# Patient Record
Sex: Male | Born: 1995 | Race: Black or African American | Hispanic: No | Marital: Single | Smoking: Never smoker
Health system: Southern US, Community
[De-identification: ages and names within clinical notes are randomized; demographics above are authoritative.]

## PROBLEM LIST (undated history)

## (undated) DIAGNOSIS — Z8249 Family history of ischemic heart disease and other diseases of the circulatory system: Secondary | ICD-10-CM

## (undated) DIAGNOSIS — R002 Palpitations: Secondary | ICD-10-CM

## (undated) DIAGNOSIS — F419 Anxiety disorder, unspecified: Secondary | ICD-10-CM

## (undated) HISTORY — DX: Palpitations: R00.2

## (undated) HISTORY — PX: NO PAST SURGERIES: SHX2092

## (undated) HISTORY — DX: Family history of ischemic heart disease and other diseases of the circulatory system: Z82.49

## (undated) HISTORY — DX: Anxiety disorder, unspecified: F41.9

---

## 2017-01-18 DIAGNOSIS — J00 Acute nasopharyngitis [common cold]: Secondary | ICD-10-CM | POA: Diagnosis not present

## 2017-01-26 DIAGNOSIS — Z23 Encounter for immunization: Secondary | ICD-10-CM | POA: Diagnosis not present

## 2017-01-26 DIAGNOSIS — J45909 Unspecified asthma, uncomplicated: Secondary | ICD-10-CM | POA: Diagnosis not present

## 2017-01-26 DIAGNOSIS — Z13220 Encounter for screening for lipoid disorders: Secondary | ICD-10-CM | POA: Diagnosis not present

## 2017-01-26 DIAGNOSIS — Z113 Encounter for screening for infections with a predominantly sexual mode of transmission: Secondary | ICD-10-CM | POA: Diagnosis not present

## 2017-01-26 DIAGNOSIS — R0989 Other specified symptoms and signs involving the circulatory and respiratory systems: Secondary | ICD-10-CM | POA: Diagnosis not present

## 2017-01-26 DIAGNOSIS — Z0000 Encounter for general adult medical examination without abnormal findings: Secondary | ICD-10-CM | POA: Diagnosis not present

## 2017-02-15 DIAGNOSIS — R030 Elevated blood-pressure reading, without diagnosis of hypertension: Secondary | ICD-10-CM | POA: Diagnosis not present

## 2017-02-15 DIAGNOSIS — J029 Acute pharyngitis, unspecified: Secondary | ICD-10-CM | POA: Diagnosis not present

## 2017-02-22 DIAGNOSIS — J040 Acute laryngitis: Secondary | ICD-10-CM | POA: Diagnosis not present

## 2017-02-22 DIAGNOSIS — J3501 Chronic tonsillitis: Secondary | ICD-10-CM | POA: Diagnosis not present

## 2017-02-22 DIAGNOSIS — J320 Chronic maxillary sinusitis: Secondary | ICD-10-CM | POA: Diagnosis not present

## 2017-02-22 DIAGNOSIS — J322 Chronic ethmoidal sinusitis: Secondary | ICD-10-CM | POA: Diagnosis not present

## 2017-03-30 DIAGNOSIS — Z113 Encounter for screening for infections with a predominantly sexual mode of transmission: Secondary | ICD-10-CM | POA: Diagnosis not present

## 2017-06-04 DIAGNOSIS — Z113 Encounter for screening for infections with a predominantly sexual mode of transmission: Secondary | ICD-10-CM | POA: Diagnosis not present

## 2017-07-27 DIAGNOSIS — R7301 Impaired fasting glucose: Secondary | ICD-10-CM | POA: Diagnosis not present

## 2017-07-27 DIAGNOSIS — J45909 Unspecified asthma, uncomplicated: Secondary | ICD-10-CM | POA: Diagnosis not present

## 2017-07-27 DIAGNOSIS — Z13220 Encounter for screening for lipoid disorders: Secondary | ICD-10-CM | POA: Diagnosis not present

## 2017-07-27 DIAGNOSIS — E669 Obesity, unspecified: Secondary | ICD-10-CM | POA: Diagnosis not present

## 2017-07-27 DIAGNOSIS — R37 Sexual dysfunction, unspecified: Secondary | ICD-10-CM | POA: Diagnosis not present

## 2017-10-18 DIAGNOSIS — Z23 Encounter for immunization: Secondary | ICD-10-CM | POA: Diagnosis not present

## 2017-10-18 DIAGNOSIS — J45909 Unspecified asthma, uncomplicated: Secondary | ICD-10-CM | POA: Diagnosis not present

## 2017-10-18 DIAGNOSIS — N529 Male erectile dysfunction, unspecified: Secondary | ICD-10-CM | POA: Diagnosis not present

## 2017-10-18 DIAGNOSIS — R030 Elevated blood-pressure reading, without diagnosis of hypertension: Secondary | ICD-10-CM | POA: Diagnosis not present

## 2017-10-18 DIAGNOSIS — Z113 Encounter for screening for infections with a predominantly sexual mode of transmission: Secondary | ICD-10-CM | POA: Diagnosis not present

## 2017-12-05 DIAGNOSIS — R030 Elevated blood-pressure reading, without diagnosis of hypertension: Secondary | ICD-10-CM | POA: Diagnosis not present

## 2017-12-08 ENCOUNTER — Encounter (HOSPITAL_COMMUNITY): Payer: Self-pay | Admitting: *Deleted

## 2017-12-08 ENCOUNTER — Emergency Department (HOSPITAL_COMMUNITY)
Admission: EM | Admit: 2017-12-08 | Discharge: 2017-12-08 | Disposition: A | Discharge status: Home / Self Care | Payer: BLUE CROSS/BLUE SHIELD | Attending: Emergency Medicine | Admitting: Emergency Medicine

## 2017-12-08 ENCOUNTER — Other Ambulatory Visit: Payer: Self-pay

## 2017-12-08 ENCOUNTER — Emergency Department (HOSPITAL_COMMUNITY): Payer: BLUE CROSS/BLUE SHIELD

## 2017-12-08 DIAGNOSIS — R002 Palpitations: Secondary | ICD-10-CM | POA: Insufficient documentation

## 2017-12-08 DIAGNOSIS — R000 Tachycardia, unspecified: Secondary | ICD-10-CM | POA: Diagnosis not present

## 2017-12-08 DIAGNOSIS — I10 Essential (primary) hypertension: Secondary | ICD-10-CM | POA: Diagnosis not present

## 2017-12-08 DIAGNOSIS — I499 Cardiac arrhythmia, unspecified: Secondary | ICD-10-CM | POA: Diagnosis not present

## 2017-12-08 LAB — CBC WITH DIFFERENTIAL/PLATELET
Abs Immature Granulocytes: 0.01 10*3/uL (ref 0.00–0.07)
BASOS ABS: 0 10*3/uL (ref 0.0–0.1)
Basophils Relative: 0 %
EOS PCT: 3 %
Eosinophils Absolute: 0.1 10*3/uL (ref 0.0–0.5)
HEMATOCRIT: 50 % (ref 39.0–52.0)
HEMOGLOBIN: 16.1 g/dL (ref 13.0–17.0)
Immature Granulocytes: 0 %
LYMPHS ABS: 1.3 10*3/uL (ref 0.7–4.0)
Lymphocytes Relative: 27 %
MCH: 28 pg (ref 26.0–34.0)
MCHC: 32.2 g/dL (ref 30.0–36.0)
MCV: 87 fL (ref 80.0–100.0)
Monocytes Absolute: 0.5 10*3/uL (ref 0.1–1.0)
Monocytes Relative: 10 %
NEUTROS ABS: 2.9 10*3/uL (ref 1.7–7.7)
NEUTROS PCT: 60 %
Platelets: 212 10*3/uL (ref 150–400)
RBC: 5.75 MIL/uL (ref 4.22–5.81)
RDW: 12.9 % (ref 11.5–15.5)
WBC: 4.9 10*3/uL (ref 4.0–10.5)
nRBC: 0 % (ref 0.0–0.2)

## 2017-12-08 LAB — BASIC METABOLIC PANEL
Anion gap: 7 (ref 5–15)
BUN: 12 mg/dL (ref 6–20)
CALCIUM: 9.2 mg/dL (ref 8.9–10.3)
CO2: 25 mmol/L (ref 22–32)
Chloride: 104 mmol/L (ref 98–111)
Creatinine, Ser: 1.08 mg/dL (ref 0.61–1.24)
GFR calc Af Amer: >60 mL/min (ref >60)
Glucose, Bld: 112 mg/dL — ABNORMAL HIGH (ref 70–99)
POTASSIUM: 3.8 mmol/L (ref 3.5–5.1)
Sodium: 136 mmol/L (ref 135–145)

## 2017-12-08 LAB — TSH: TSH: 0.915 u[IU]/mL (ref 0.350–4.500)

## 2017-12-08 LAB — I-STAT TROPONIN, ED: Troponin i, poc: 0 ng/mL (ref 0.00–0.08)

## 2017-12-08 MED ORDER — LORAZEPAM 1 MG PO TABS
1.0000 mg | ORAL_TABLET | Freq: Once | ORAL | Status: AC
  Administered 2017-12-08: 1 mg via ORAL
  Filled 2017-12-08: qty 1

## 2017-12-08 NOTE — ED Triage Notes (Signed)
Pt called EMS because he felt his heart beating fast. Ems reported HER of 140 on arrival to Pt home ,BP 161/92. Marland Kitchen Pt reported he went to [facility] for same on this past WED.

## 2017-12-08 NOTE — ED Notes (Signed)
Pt verbalized understanding of discharge paperwork and follow-up care.  °

## 2017-12-08 NOTE — ED Provider Notes (Signed)
[facility] EMERGENCY DEPARTMENT Provider Note   CSN: [id number] Arrival date & time: 12/08/17  1254     History   Chief Complaint Chief Complaint  Patient presents with  . Palpitations    HPI Dean Barrett is a 22 y.o. male.  He is complaining about 1 week of intermittent palpitations.  Says his heart rate will go fast intermittently throughout the day.  It causes him to feel really anxious when it happens.  It does not seem to be predictably related around anything although when he is working out he says he does not have any problem with it.  He denies any chest pain or shortness of breath.  No recent illness.  He said he was taking some over-the-counter workout supplements last week but he has stopped them and he is continuing to have symptoms.  He went to an urgent care clinic a few days ago and they told him he needed to follow-up with his primary care doctor.  They have a family history of cardiomyopathy.  The history is provided by the patient.  Palpitations   This is a new problem. The current episode started more than 1 week ago. The problem occurs daily. The problem has not changed since onset.The problem is associated with an unknown factor. Pertinent negatives include no fever, no chest pain, no syncope, no abdominal pain, no nausea, no vomiting, no headaches, no cough, no hemoptysis and no shortness of breath. He has tried nothing for the symptoms. The treatment provided no relief. Risk factors include family history.    History reviewed. No pertinent past medical history.  There are no active problems to display for this patient.   History reviewed. No pertinent surgical history.      Home Medications    Prior to Admission medications   Not on File    Family History History reviewed. No pertinent family history.  Social History Social History   Tobacco Use  . Smoking status: Never Smoker  . Smokeless tobacco: Never Used  Substance Use  Topics  . Alcohol use: Not on file    Comment: social  . Drug use: Not Currently     Allergies   Patient has no allergy information on record.   Review of Systems Review of Systems  Constitutional: Negative for fever.  HENT: Negative for sore throat.   Eyes: Negative for visual disturbance.  Respiratory: Negative for cough, hemoptysis and shortness of breath.   Cardiovascular: Positive for palpitations. Negative for chest pain and syncope.  Gastrointestinal: Negative for abdominal pain, nausea and vomiting.  Genitourinary: Negative for dysuria.  Musculoskeletal: Negative for neck pain.  Skin: Negative for rash.  Neurological: Negative for headaches.     Physical Exam Updated Vital Signs BP 139/68 (BP Location: Right Arm)   Pulse 94   Temp 98.2 F (36.8 C) (Oral)   Resp 18   Ht 5\' 7"  (1.702 m)   Wt 111.6 kg   SpO2 98%   BMI 38.53 kg/m   Physical Exam  Constitutional: He appears well-developed and well-nourished.  HENT:  Head: Normocephalic and atraumatic.  Eyes: Conjunctivae are normal.  Neck: Neck supple. No tracheal deviation present. No thyromegaly present.  Cardiovascular: Regular rhythm and normal pulses. Tachycardia present.  No murmur heard. Pulmonary/Chest: Effort normal and breath sounds normal. No respiratory distress.  Abdominal: Soft. There is no tenderness.  Musculoskeletal: He exhibits no edema, tenderness or deformity.  Neurological: He is alert.  Skin: Skin is warm and dry.  Psychiatric: He has a normal mood and affect.  Nursing note and vitals reviewed.    ED Treatments / Results  Labs (all labs ordered are listed, but only abnormal results are displayed) Labs Reviewed  BASIC METABOLIC PANEL - Abnormal; Notable for the following components:      Result Value   Glucose, Bld 112 (*)    All other components within normal limits  CBC WITH DIFFERENTIAL/PLATELET  TSH  I-STAT TROPONIN, ED    EKG EKG Interpretation  Date/Time:  Saturday  December 08 2017 12:55:30 EST Ventricular Rate:  89 PR Interval:    QRS Duration: 107 QT Interval:  366 QTC Calculation: 446 R Axis:   77 Text Interpretation:  Sinus rhythm ST elev, probable normal early repol pattern Artifact in lead(s) V2 no prior to compare with Confirmed by Meridee Score [phone]) on 12/08/2017 1:12:38 PM   Radiology Dg Chest 2 View  Result Date: 12/08/2017 CLINICAL DATA:  Palpitations. EXAM: CHEST - 2 VIEW COMPARISON:  None. FINDINGS: The heart size and mediastinal contours are within normal limits. Both lungs are clear. The visualized skeletal structures are unremarkable. IMPRESSION: No active cardiopulmonary disease. Electronically Signed   By: Lupita Raider, M.D.   On: 12/08/2017 14:47    Procedures Procedures (including critical care time)  Medications Ordered in ED Medications - No data to display   Initial Impression / Assessment and Plan / ED Course  I have reviewed the triage vital signs and the nursing notes.  Pertinent labs & imaging results that were available during my care of the patient were reviewed by me and considered in my medical decision making (see chart for details).    22year-old male with no significant medical history here with 1 week of intermittent palpitations.  Is a normal EKG here along with unremarkable labs and physical exam.  His heart rate is improved on its own here.  I have put in for an amatory referral to cardiology so he can potentially be evaluated for Holter monitor.  He was given clear instructions for return if any worsening symptoms.  Final Clinical Impressions(s) / ED Diagnoses   Final diagnoses:  Palpitations    ED Discharge Orders         Ordered    Ambulatory referral to Cardiology     12/08/17 1457           Terrilee Files, MD 12/08/17 1537

## 2017-12-08 NOTE — Discharge Instructions (Addendum)
You were evaluated in the emergency room for 1 week of episodes of rapid heart rate.  You had blood work EKG and a chest x-ray that did not show an obvious cause of your symptoms.  Your heart rate improved while you were here.  He will be important for you to limit all stimulants like caffeine, energy drinks, or supplements.  Stay well-hydrated.  We are attempting to get you a follow-up with cardiology so they can evaluate you for possible Holter monitor.  Please return if you have any concerns.

## 2017-12-08 NOTE — ED Notes (Signed)
Patient transported to X-ray 

## 2017-12-10 DIAGNOSIS — F418 Other specified anxiety disorders: Secondary | ICD-10-CM | POA: Diagnosis not present

## 2017-12-10 DIAGNOSIS — R002 Palpitations: Secondary | ICD-10-CM | POA: Diagnosis not present

## 2017-12-19 DIAGNOSIS — J322 Chronic ethmoidal sinusitis: Secondary | ICD-10-CM | POA: Diagnosis not present

## 2017-12-19 DIAGNOSIS — J301 Allergic rhinitis due to pollen: Secondary | ICD-10-CM | POA: Diagnosis not present

## 2017-12-19 DIAGNOSIS — J320 Chronic maxillary sinusitis: Secondary | ICD-10-CM | POA: Diagnosis not present

## 2017-12-19 DIAGNOSIS — J3089 Other allergic rhinitis: Secondary | ICD-10-CM | POA: Diagnosis not present

## 2018-01-04 ENCOUNTER — Ambulatory Visit: Payer: BLUE CROSS/BLUE SHIELD | Admitting: Cardiology

## 2018-01-07 ENCOUNTER — Encounter: Payer: Self-pay | Admitting: *Deleted

## 2018-03-26 NOTE — Progress Notes (Signed)
Cardiology Office Note   Date:  03/29/2018   ID:  Ranier Vanderwoude, DOB December 11, 1995, MRN [id number]  PCP:  Tally Joe, MD  Cardiologist:   Charlton Haws, MD   No chief complaint on file.    History of Present Illness: Dean Barrett is a 23 y.o. male who presents for consultation regarding palpitations. Referred by Dr Perlie Gold ER Reviewed his note from 12/08/17 Patient had 7 days of palpitations. Feels anxious with more rapid HR. Not worse with working out at gym No chest pain dyspnea or pre syncope Was taking some OTC supplements but stopped with no improvement  There is a family history of DCM  Labs normal including TSH CXR NAD no CE ECG SR rate 89 early repolarization no pre excitation 12/09/17  Telemetry normal no arrhythmia   He works at [employer] last 7 years Lives at home with mom and brother Works out without issues Weight up a bit. Palpitations worse at night When he lays down No drugs, excess caffeine or alcohol Denies anxiety but does indicate stress may be involved   Past Medical History:  Diagnosis Date  . Anxiety   . Family history of cardiomyopathy   . Palpitations     Past Surgical History:  Procedure Laterality Date  . NO PAST SURGERIES       Current Outpatient Medications  Medication Sig Dispense Refill  . albuterol (PROVENTIL HFA;VENTOLIN HFA) 108 (90 Base) MCG/ACT inhaler 2 puffs every 4 (four) hours as needed. for wheezing  5   No current facility-administered medications for this visit.     Allergies:   Patient has no allergy information on record.    Social History:  The patient  reports that he has never smoked. He has never used smokeless tobacco. He reports previous drug use.   Family History:  The patient's family history is not on file.    ROS:  Please see the history of present illness.   Otherwise, review of systems are positive for none.   All other systems are reviewed and negative.    PHYSICAL EXAM: VS:  BP 138/86   Pulse 77   Ht 5'  7" (1.702 m)   Wt 113.4 kg   SpO2 98%   BMI 39.16 kg/m  , BMI Body mass index is 39.16 kg/m. Affect appropriate Healthy:  appears stated age HEENT: normal Neck supple with no adenopathy JVP normal no bruits no thyromegaly Lungs clear with no wheezing and good diaphragmatic motion Heart:  S1/S2 no murmur, no rub, gallop or click PMI normal Abdomen: benighn, BS positve, no tenderness, no AAA no bruit.  No HSM or HJR Distal pulses intact with no bruits No edema Neuro non-focal Skin warm and dry No muscular weakness    EKG:  See HPI  SR rate 77 normal 03/29/18    Recent Labs: 12/08/2017: BUN 12; Creatinine, Ser 1.08; Hemoglobin 16.1; Platelets 212; Potassium 3.8; Sodium 136; TSH 0.915    Lipid Panel No results found for: CHOL, TRIG, HDL, CHOLHDL, VLDL, LDLCALC, LDLDIRECT    Wt Readings from Last 3 Encounters:  03/29/18 113.4 kg  12/08/17 111.6 kg      Other studies Reviewed: Additional studies/ records that were reviewed today include: Notes from ER, labs , ECG.    ASSESSMENT AND PLAN:  1.  Palpitations:  Benign normal ECG and exam 30 day event monitor and TTE ordered    Current medicines are reviewed at length with the patient today.  The patient does  not have concerns regarding medicines.  The following changes have been made:  no change  Labs/ tests ordered today include: 30 day event monitor and TTE  No orders of the defined types were placed in this encounter.    Disposition:   FU with cardiology PRN      Signed, Charlton Haws, MD  03/29/2018 9:12 AM    [facility] [address] [place], [place], [place]  [address] Phone: [phone]; Fax: [phone]

## 2018-03-29 ENCOUNTER — Encounter: Payer: Self-pay | Admitting: Cardiovascular Disease

## 2018-03-29 ENCOUNTER — Encounter (INDEPENDENT_AMBULATORY_CARE_PROVIDER_SITE_OTHER): Payer: Self-pay

## 2018-03-29 ENCOUNTER — Ambulatory Visit: Payer: BLUE CROSS/BLUE SHIELD | Admitting: Cardiovascular Disease

## 2018-03-29 VITALS — BP 138/86 | HR 77 | Ht 67.0 in | Wt 250.0 lb

## 2018-03-29 DIAGNOSIS — R002 Palpitations: Secondary | ICD-10-CM

## 2018-03-29 NOTE — Patient Instructions (Addendum)
Medication Instructions:   If you need a refill on your cardiac medications before your next appointment, please call your pharmacy.   Lab work:  If you have labs (blood work) drawn today and your tests are completely normal, you will receive your results only by: Marland Kitchen MyChart Message (if you have MyChart) OR . A paper copy in the mail If you have any lab test that is abnormal or we need to change your treatment, we will call you to review the results.  Testing/Procedures: Your physician has recommended that you wear a 30 day event monitor. Event monitors are medical devices that record the heart's electrical activity. Doctors most often [place] these monitors to diagnose arrhythmias. Arrhythmias are problems with the speed or rhythm of the heartbeat. The monitor is a small, portable device. You can wear one while you do your normal daily activities. This is usually used to diagnose what is causing palpitations/syncope (passing out).  Your physician has requested that you have an echocardiogram. Echocardiography is a painless test that uses sound waves to create images of your heart. It provides your doctor with information about the size and shape of your heart and how well your heart's chambers and valves are working. This procedure takes approximately one hour. There are no restrictions for this procedure.  Follow-Up: At [facility], you and your health needs are our priority.  As part of our continuing mission to provide you with exceptional heart care, we have created designated Provider Care Teams.  These Care Teams include your primary Cardiologist (physician) and Advanced Practice Providers (APPs -  Physician Assistants and Nurse Practitioners) who all work together to provide you with the care you need, when you need it. Your physician recommends that you schedule a follow-up appointment as needed with Dr. Eden Emms.

## 2018-04-12 ENCOUNTER — Other Ambulatory Visit: Payer: Self-pay

## 2018-04-12 ENCOUNTER — Ambulatory Visit (INDEPENDENT_AMBULATORY_CARE_PROVIDER_SITE_OTHER): Payer: BLUE CROSS/BLUE SHIELD

## 2018-04-12 ENCOUNTER — Encounter (HOSPITAL_COMMUNITY): Payer: Self-pay

## 2018-04-12 ENCOUNTER — Ambulatory Visit (HOSPITAL_COMMUNITY): Payer: BLUE CROSS/BLUE SHIELD | Attending: Cardiology

## 2018-04-12 DIAGNOSIS — R002 Palpitations: Secondary | ICD-10-CM

## 2018-05-17 ENCOUNTER — Telehealth: Payer: Self-pay | Admitting: Cardiovascular Disease

## 2018-05-17 NOTE — Telephone Encounter (Signed)
Called patient back with his monitor results.

## 2018-05-17 NOTE — Telephone Encounter (Signed)
New Message  Patient states someone called him today and he's returning the call.

## 2018-07-03 DIAGNOSIS — R1313 Dysphagia, pharyngeal phase: Secondary | ICD-10-CM | POA: Diagnosis not present

## 2018-07-03 DIAGNOSIS — J300 Vasomotor rhinitis: Secondary | ICD-10-CM | POA: Diagnosis not present

## 2018-11-26 ENCOUNTER — Encounter (INDEPENDENT_AMBULATORY_CARE_PROVIDER_SITE_OTHER): Payer: Self-pay

## 2018-12-06 DIAGNOSIS — R030 Elevated blood-pressure reading, without diagnosis of hypertension: Secondary | ICD-10-CM | POA: Diagnosis not present

## 2018-12-06 DIAGNOSIS — Z202 Contact with and (suspected) exposure to infections with a predominantly sexual mode of transmission: Secondary | ICD-10-CM | POA: Diagnosis not present

## 2018-12-06 DIAGNOSIS — Z23 Encounter for immunization: Secondary | ICD-10-CM | POA: Diagnosis not present

## 2018-12-25 DIAGNOSIS — H04123 Dry eye syndrome of bilateral lacrimal glands: Secondary | ICD-10-CM | POA: Diagnosis not present

## 2018-12-25 DIAGNOSIS — H40033 Anatomical narrow angle, bilateral: Secondary | ICD-10-CM | POA: Diagnosis not present

## 2019-01-14 IMAGING — DX DG CHEST 2V
2 series · 2 of 2 positions shown · non-contrast
Comparison: None.

CLINICAL DATA: Palpitations.

EXAM:
CHEST - 2 VIEW

[chest pa]
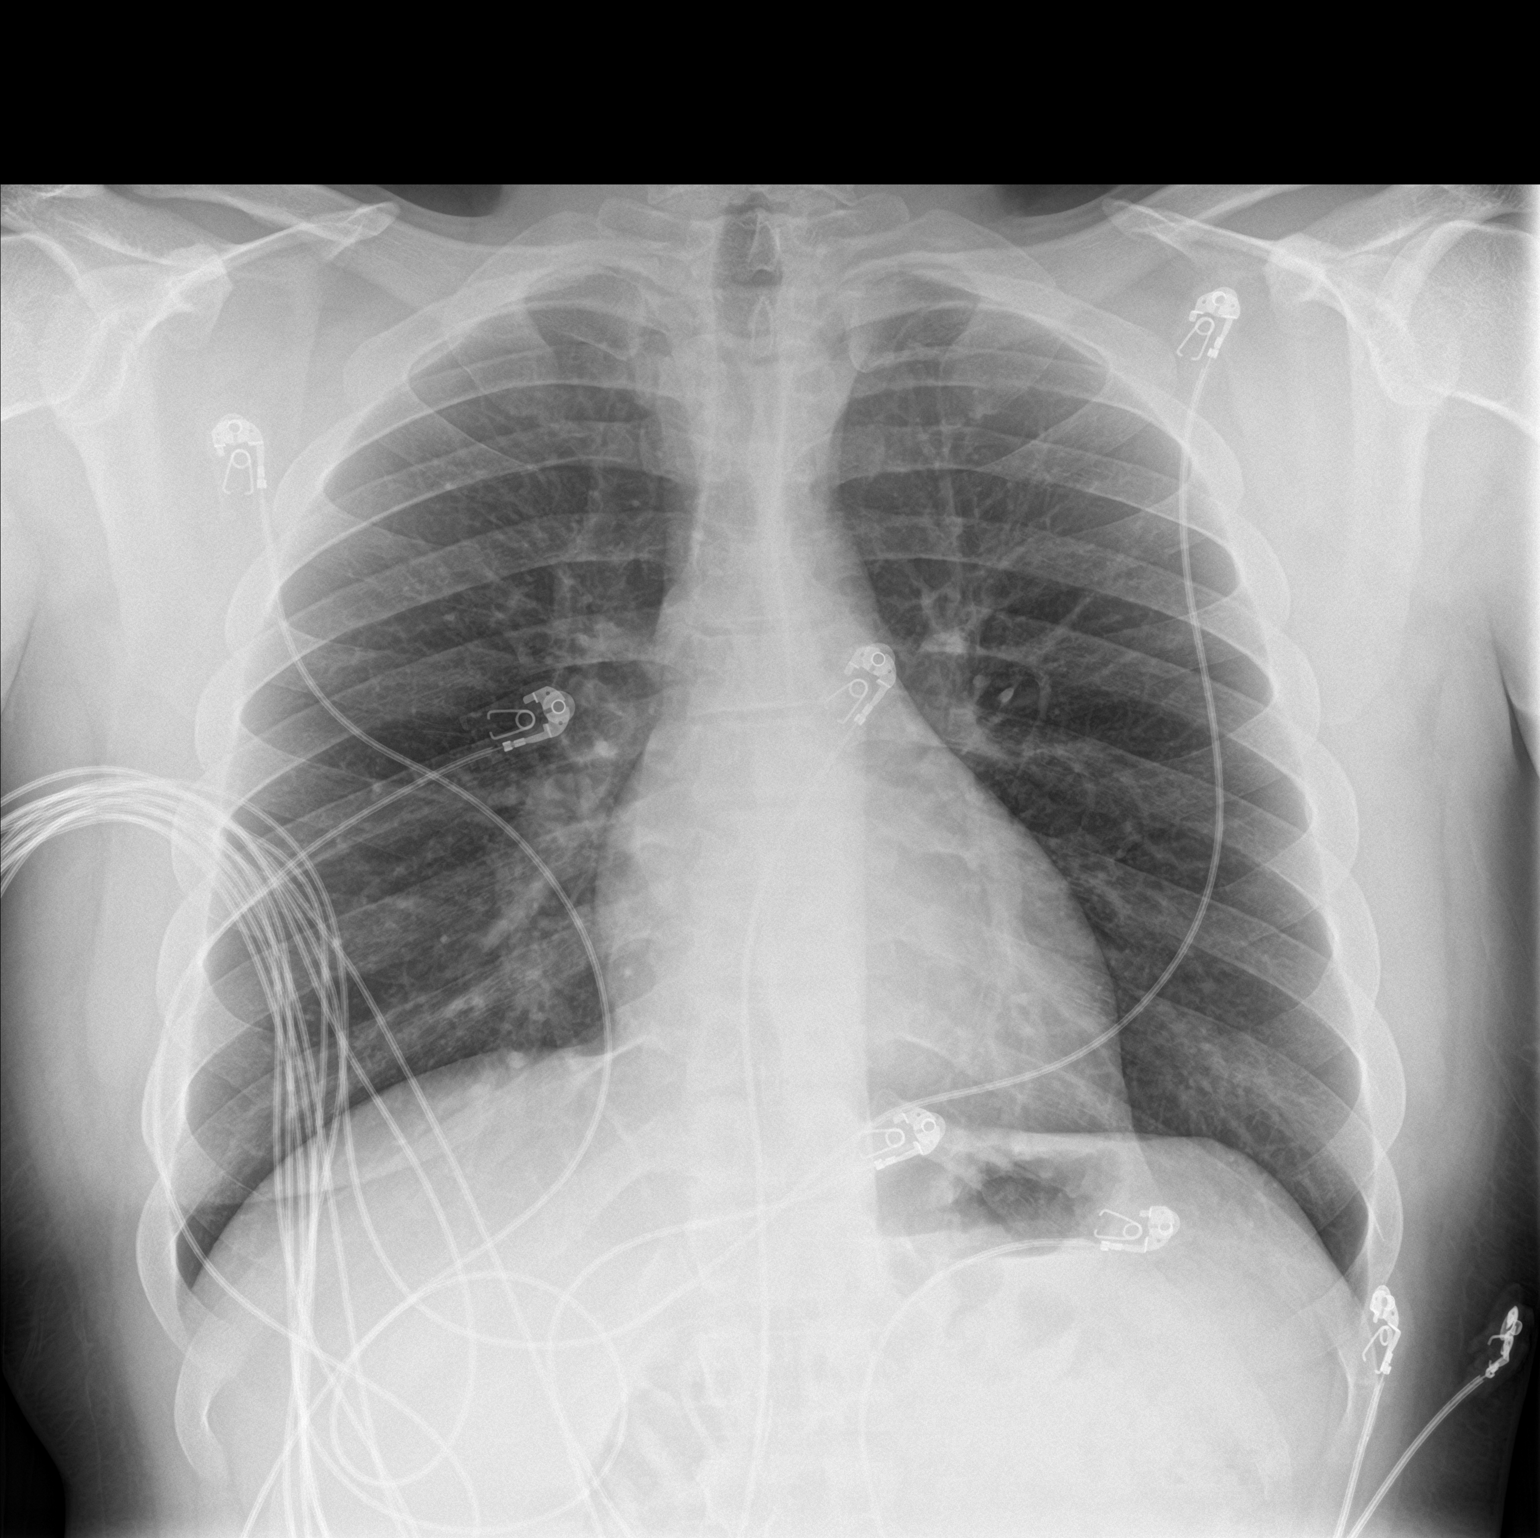

[chest lat]
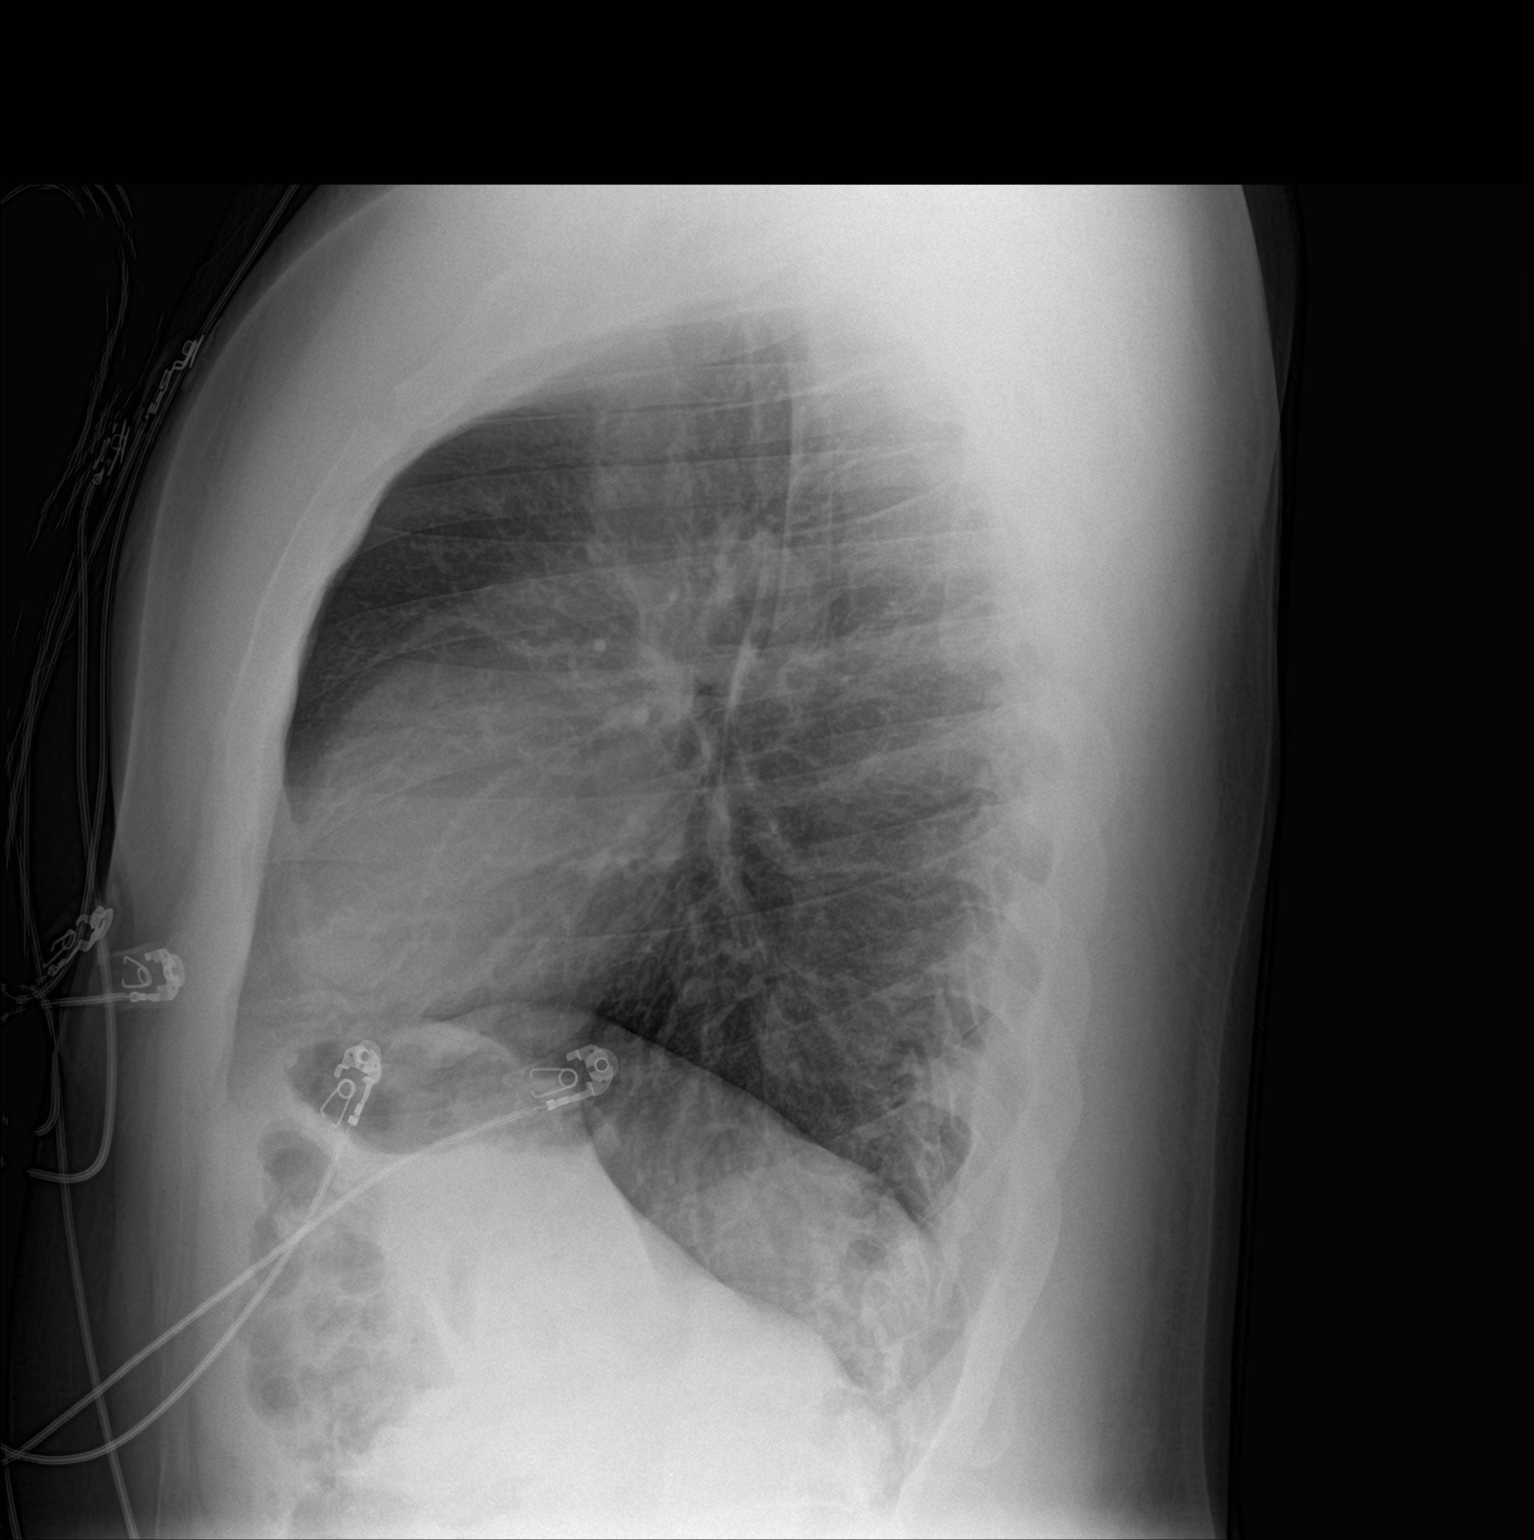

[2 of 2 positions shown; findings below may reference images not displayed]

FINDINGS: The heart size and mediastinal contours are within normal limits.
Both lungs are clear. The visualized skeletal structures are
unremarkable.
IMPRESSION: No active cardiopulmonary disease.
